# Patient Record
Sex: Male | Born: 1970 | Race: White | Hispanic: No | Smoking: Never smoker
Health system: Southern US, Community
[De-identification: ages and names within clinical notes are randomized; demographics above are authoritative.]

## PROBLEM LIST (undated history)

## (undated) DIAGNOSIS — J45909 Unspecified asthma, uncomplicated: Secondary | ICD-10-CM

## (undated) DIAGNOSIS — E785 Hyperlipidemia, unspecified: Secondary | ICD-10-CM

## (undated) HISTORY — PX: HERNIA REPAIR: SHX51

## (undated) HISTORY — PX: CHOLECYSTECTOMY: SHX55

## (undated) HISTORY — DX: Hyperlipidemia, unspecified: E78.5

## (undated) HISTORY — DX: Unspecified asthma, uncomplicated: J45.909

---

## 2013-08-07 ENCOUNTER — Other Ambulatory Visit: Payer: Self-pay | Admitting: Orthopedic Surgery

## 2013-08-07 DIAGNOSIS — M79672 Pain in left foot: Secondary | ICD-10-CM

## 2018-06-13 ENCOUNTER — Other Ambulatory Visit: Payer: Self-pay | Admitting: Gastroenterology

## 2018-06-13 DIAGNOSIS — R1033 Periumbilical pain: Secondary | ICD-10-CM

## 2018-06-19 ENCOUNTER — Ambulatory Visit
Admission: RE | Admit: 2018-06-19 | Discharge: 2018-06-19 | Disposition: A | Discharge status: Home / Self Care | Payer: BLUE CROSS/BLUE SHIELD | Source: Ambulatory Visit | Attending: Gastroenterology | Admitting: Gastroenterology

## 2018-06-19 DIAGNOSIS — R1033 Periumbilical pain: Secondary | ICD-10-CM

## 2018-06-19 MED ORDER — IOPAMIDOL (ISOVUE-300) INJECTION 61%
100.0000 mL | Freq: Once | INTRAVENOUS | Status: AC | PRN
  Administered 2018-06-19: 100 mL via INTRAVENOUS

## 2020-08-12 ENCOUNTER — Other Ambulatory Visit: Payer: Self-pay

## 2020-08-12 ENCOUNTER — Ambulatory Visit (INDEPENDENT_AMBULATORY_CARE_PROVIDER_SITE_OTHER): Payer: BC Managed Care – PPO | Admitting: Cardiology

## 2020-08-12 ENCOUNTER — Encounter: Payer: Self-pay | Admitting: Cardiology

## 2020-08-12 VITALS — BP 115/78 | HR 58 | Temp 97.5°F | Ht 70.0 in | Wt 177.4 lb

## 2020-08-12 DIAGNOSIS — R0609 Other forms of dyspnea: Secondary | ICD-10-CM

## 2020-08-12 DIAGNOSIS — E785 Hyperlipidemia, unspecified: Secondary | ICD-10-CM | POA: Insufficient documentation

## 2020-08-12 DIAGNOSIS — Z8249 Family history of ischemic heart disease and other diseases of the circulatory system: Secondary | ICD-10-CM | POA: Insufficient documentation

## 2020-08-12 DIAGNOSIS — Z01812 Encounter for preprocedural laboratory examination: Secondary | ICD-10-CM

## 2020-08-12 DIAGNOSIS — R072 Precordial pain: Secondary | ICD-10-CM | POA: Diagnosis not present

## 2020-08-12 DIAGNOSIS — R0600 Dyspnea, unspecified: Secondary | ICD-10-CM | POA: Diagnosis not present

## 2020-08-12 DIAGNOSIS — E7800 Pure hypercholesterolemia, unspecified: Secondary | ICD-10-CM | POA: Diagnosis not present

## 2020-08-12 DIAGNOSIS — Z7189 Other specified counseling: Secondary | ICD-10-CM

## 2020-08-12 LAB — BASIC METABOLIC PANEL
BUN/Creatinine Ratio: 15 (ref 9–20)
BUN: 20 mg/dL (ref 6–24)
CO2: 25 mmol/L (ref 20–29)
Calcium: 10.2 mg/dL (ref 8.7–10.2)
Chloride: 103 mmol/L (ref 96–106)
Creatinine, Ser: 1.36 mg/dL — ABNORMAL HIGH (ref 0.76–1.27)
GFR calc Af Amer: 70 mL/min/{1.73_m2} (ref >59)
GFR calc non Af Amer: 60 mL/min/{1.73_m2} (ref >59)
Glucose: 96 mg/dL (ref 65–99)
Potassium: 5.5 mmol/L — ABNORMAL HIGH (ref 3.5–5.2)
Sodium: 140 mmol/L (ref 134–144)

## 2020-08-12 NOTE — Progress Notes (Signed)
Cardiology Office Note:    Date:  08/12/2020   ID:  Jake Melendez, DOB [birth year], MRN [id number]  PCP:  Marda Stalker, PA-C  Cardiologist:  Buford Dresser, MD  Referring MD: Marda Stalker, PA-C   CC: new patient consultation for hypercholesterolemia and dyspnea on exertion  History of Present Illness:    Jake Melendez is a 49 y.o. male with a hx of hyperlipidemia who is seen as a new consult at the request of Marda Stalker, PA-C for the evaluation and management of hyperlipidemia and exertional dyspnea.  Note from 07/06/20 from visit with Marda Stalker, PA reviewed. Noted to have hyperlipidemia. Has changed diet and lifestyle. Recommended to discussed coronary calcium score.  Reviewed history of cholesterol labs per [facility] notes: 06/29/20 Tchol 248, TG 109, HDL 55, LDL 174, NHDL 193  09/25/19 Tchol 277, TG 87, LDL 204, NHDL 221  10/08/2017 Tchol 223, TG 100, LDL 157, HDL 46  Today: Concerned that he has had exertional dyspnea in recent months. Used to race bikes, very active. Resting heart rate used to be in 50s, now closer to 60, which concerns him. Had foot issue, has been less active in the last three months but plans to start back.  Some days can walk 5-6 miles without issues, other times he has to stop from shortness of breath after walking about 10 minutes.  Cardiovascular risk factors: Prior clinical ASCVD: none Comorbid conditions: hyperlipidemia. Denies hypertension, diabetes, chronic kidney disease Metabolic syndrome/Obesity: BMI 25 Chronic inflammatory conditions: none Tobacco use history: never Family history: Mat Gma with stroke, Teena Irani had MI in her 65s. Pat Gpa died of MI in his early 28s (was a smoker). Father active, healthy in his late 34s. Mother is active, has high blood pressure. 1 sister, 5 years younger, healthy. Prior cardiac testing and/or incidental findings on other testing (ie coronary calcium): Exercise  level: used to be very active with biking, still walks. Getting back to biking now. Current diet: has been strict plant based in the past, has loosened up slightly recently. Difficult as partner, 4 kids don't eat plant based.  Denies chest pain, shortness of breath at rest. No PND, orthopnea, LE edema or unexpected weight gain. No syncope or palpitations.   Past Medical History:  Diagnosis Date  . Asthma   . Hyperlipidemia     Past Surgical History:  Procedure Laterality Date  . CHOLECYSTECTOMY    . HERNIA REPAIR      Current Medications: Current Outpatient Medications on File Prior to Visit  Medication Sig  . albuterol (PROVENTIL) (5 MG/ML) 0.5% nebulizer solution Take 2.5 mg by nebulization every 6 (six) hours as needed for wheezing or shortness of breath.  . budesonide-formoterol (SYMBICORT) 80-4.5 MCG/ACT inhaler Inhale 2 puffs into the lungs daily.  . cetirizine (ZYRTEC) 10 MG tablet Take 10 mg by mouth daily.  Marland Kitchen EPINEPHrine 0.3 mg/0.3 mL IJ SOAJ injection SMARTSIG:1 Pre-Filled Pen Syringe IM Once PRN  . mometasone (NASONEX) 50 MCG/ACT nasal spray Place 2 sprays into the nose as needed.   No current facility-administered medications on file prior to visit.     Allergies:   Patient has no allergy information on record.   Social History   Tobacco Use  . Smoking status: Never Smoker  . Smokeless tobacco: Never Used  Substance Use Topics  . Alcohol use: Not Currently  . Drug use: Never    Family History: family history includes Heart attack in his paternal grandfather and paternal grandmother; Stroke  in his maternal grandmother.  ROS:   Please see the history of present illness.  Additional pertinent ROS: Constitutional: Negative for chills, fever, night sweats, unintentional weight loss  HENT: Negative for ear pain and hearing loss.   Eyes: Negative for loss of vision and eye pain.  Respiratory: Negative for cough, sputum, wheezing.   Cardiovascular: See  HPI. Gastrointestinal: Negative for abdominal pain, melena, and hematochezia.  Genitourinary: Negative for dysuria and hematuria.  Musculoskeletal: Negative for falls and myalgias.  Skin: Negative for itching and rash.  Neurological: Negative for focal weakness, focal sensory changes and loss of consciousness.  Endo/Heme/Allergies: Does not bruise/bleed easily.     EKGs/Labs/Other Studies Reviewed:    The following studies were reviewed today: No prior cardiac studies  EKG:  EKG is personally reviewed.  The ekg ordered today demonstrates sinus bradycardia at 58 bpm  Recent Labs: No results found for requested labs within last 8760 hours.  Recent Lipid Panel No results found for: CHOL, TRIG, HDL, CHOLHDL, VLDL, LDLCALC, LDLDIRECT  Physical Exam:    VS:  BP 115/78   Pulse (!) 58   Temp (!) 97.5 F (36.4 C)   Ht 5' 10"  (1.778 m)   Wt 177 lb 6.4 oz (80.5 kg)   SpO2 98%   BMI 25.45 kg/m     Wt Readings from Last 3 Encounters:  08/12/20 177 lb 6.4 oz (80.5 kg)    GEN: Well nourished, well developed in no acute distress HEENT: Normal, moist mucous membranes NECK: No JVD CARDIAC: regular rhythm, normal S1 and S2, no rubs or gallops. No murmurs. VASCULAR: Radial and DP pulses 2+ bilaterally. No carotid bruits RESPIRATORY:  Clear to auscultation without rales, wheezing or rhonchi  ABDOMEN: Soft, non-tender, non-distended MUSCULOSKELETAL:  Ambulates independently SKIN: Warm and dry, no edema NEUROLOGIC:  Alert and oriented x 3. No focal neuro deficits noted. PSYCHIATRIC:  Normal affect    ASSESSMENT:    1. Dyspnea on exertion   2. Family history of premature CAD   3. Pure hypercholesterolemia   4. Precordial pain   5. Pre-procedure lab exam   6. Cardiac risk counseling   7. Counseling on health promotion and disease prevention    PLAN:    Dyspnea on exertion Hypercholesterolemia Family history of premature CAD -I am concerned that his dyspnea on exertion is an  anginal equivalent. He has longstanding hypercholesterolemia and a family history of premature CAD, with his grandfather dying of an MI in his early 53s.  -we did discuss a calcium score, but with his symptoms I would prefer an evaluation for ischemia -discussed treadmill stress, nuclear stress/lexiscan, and CT coronary angiography. Discussed pros and cons of each, including but not limited to false positive/false negative risk, radiation risk, and risk of IV contrast dye. Based on shared decision making, decision was made to pursue CT coronary angiography. -resting heart rate 58 bpm, will hold on oral beta blocker but can get IV if needed. -counseled on need to get BMET prior to test -counseled on use of sublingual nitroglycerin and its importance to a good test  Cardiac risk counseling and prevention recommendations: -recommend heart healthy/Mediterranean diet, with whole grains, fruits, vegetable, fish, lean meats, nuts, and olive oil. Limit salt. -recommend moderate walking, 3-5 times/week for 30-50 minutes each session. Aim for at least 150 minutes.week. Goal should be pace of 3 miles/hours, or walking 1.5 miles in 30 minutes -recommend avoidance of tobacco products. Avoid excess alcohol. -ASCVD risk score: 3.5% ten year, but  50% lifetime The ASCVD Risk score Mikey Bussing DC Jr., et al., 2013) failed to calculate for the following reasons:   Cannot find a previous HDL lab   Cannot find a previous total cholesterol lab    Plan for follow up: to be determined based on results of testing  Buford Dresser, MD, PhD [place]  [facility]    Medication Adjustments/Labs and Tests Ordered: Current medicines are reviewed at length with the patient today.  Concerns regarding medicines are outlined above.  Orders Placed This Encounter  Procedures  . CT CORONARY MORPH W/CTA COR W/SCORE W/CA W/CM &/OR WO/CM  . CT CORONARY FRACTIONAL FLOW RESERVE DATA PREP  . CT CORONARY FRACTIONAL FLOW  RESERVE FLUID ANALYSIS  . Basic metabolic panel  . EKG 12-Lead   No orders of the defined types were placed in this encounter.   Patient Instructions  Medication Instructions:  Your Physician recommend you continue on your current medication as directed.    *If you need a refill on your cardiac medications before your next appointment, please call your pharmacy*   Lab Work: Your physician recommends that you return for lab work today ( BMP).  If you have labs (blood work) drawn today and your tests are completely normal, you will receive your results only by: Marland Kitchen MyChart Message (if you have MyChart) OR . A paper copy in the mail If you have any lab test that is abnormal or we need to change your treatment, we will call you to review the results.   Testing/Procedures: Cardiac CT Angiography (CTA), is a special type of CT scan that uses a computer to produce multi-dimensional views of major blood vessels throughout the body. In CT angiography, a contrast material is injected through an IV to help visualize the blood vessels [facility]   Follow-Up: At [facility], you and your health needs are our priority.  As part of our continuing mission to provide you with exceptional heart care, we have created designated Provider Care Teams.  These Care Teams include your primary Cardiologist (physician) and Advanced Practice Providers (APPs -  Physician Assistants and Nurse Practitioners) who all work together to provide you with the care you need, when you need it.  We recommend signing up for the patient portal called "MyChart".  Sign up information is provided on this After Visit Summary.  MyChart is used to connect with patients for Virtual Visits (Telemedicine).  Patients are able to view lab/test results, encounter notes, upcoming appointments, etc.  Non-urgent messages can be sent to your provider as well.   To learn more about what you can do with MyChart, go to  [email or web address].    Your next appointment:   Based off test result  The format for your next appointment:   In Person  Provider:   Buford Dresser, MD   Your cardiac CT will be scheduled at one of the below locations:   [facility] [address] [place], [place] [address] [phone]  If scheduled at [facility], please arrive at the [facility] main entrance of [facility] 30 minutes prior to test start time. Proceed to the [facility] Radiology Department (first floor) to check-in and test prep.  If scheduled at [facility], please arrive 15 mins early for check-in and test prep.  Please follow these instructions carefully (unless otherwise directed):  Hold all erectile dysfunction medications at least 3 days (72 hrs) prior to test.  On the  Night Before the Test: . Be sure to Drink plenty of water. . Do not consume any caffeinated/decaffeinated beverages or chocolate 12 hours prior to your test. . Do not take any antihistamines 12 hours prior to your test.   On the Day of the Test: . Drink plenty of water. Do not drink any water within one hour of the test. . Do not eat any food 4 hours prior to the test. . You may take your regular medications prior to the test.   After the Test: . Drink plenty of water. . After receiving IV contrast, you may experience a mild flushed feeling. This is normal. . On occasion, you may experience a mild rash up to 24 hours after the test. This is not dangerous. If this occurs, you can take Benadryl 25 mg and increase your fluid intake. . If you experience trouble breathing, this can be serious. If it is severe call 911 IMMEDIATELY. If it is mild, please call our office. . If you take any of these medications: Glipizide/Metformin, Avandament, Glucavance, please do not take 48 hours after completing test unless otherwise instructed.   Once we have confirmed  authorization from your insurance company, we will call you to set up a date and time for your test. Based on how quickly your insurance processes prior authorizations requests, please allow up to 4 weeks to be contacted for scheduling your Cardiac CT appointment. Be advised that routine Cardiac CT appointments could be scheduled as many as 8 weeks after your provider has ordered it.  For non-scheduling related questions, please contact the cardiac imaging nurse navigator should you have any questions/concerns: Marchia Bond, Cardiac Imaging Nurse Navigator Burley Saver, Interim Cardiac Imaging Nurse [facility] Direct Office Dial: [phone]   For scheduling needs, including cancellations and rescheduling, please call Vivien Rota at [phone], option 3.    Cardiac CT Angiogram A cardiac CT angiogram is a procedure to look at the heart and the area around the heart. It may be done to help find the cause of chest pains or other symptoms of heart disease. During this procedure, a substance called contrast dye is injected into the blood vessels in the area to be checked. A large X-ray machine, called a CT scanner, then takes detailed pictures of the heart and the surrounding area. The procedure is also sometimes called a coronary CT angiogram, coronary artery scanning, or CTA. A cardiac CT angiogram allows the health care provider to see how well blood is flowing to and from the heart. The health care provider will be able to see if there are any problems, such as:  Blockage or narrowing of the coronary arteries in the heart.  Fluid around the heart.  Signs of weakness or disease in the muscles, valves, and tissues of the heart. Tell a health care provider about:  Any allergies you have. This is especially important if you have had a previous allergic reaction to contrast dye.  All medicines you are taking, including vitamins, herbs, eye drops, creams, and  over-the-counter medicines.  Any blood disorders you have.  Any surgeries you have had.  Any medical conditions you have.  Whether you are pregnant or may be pregnant.  Any anxiety disorders, chronic pain, or other conditions you have that may increase your stress or prevent you from lying still. What are the risks? Generally, this is a safe procedure. However, problems may occur, including:  Bleeding.  Infection.  Allergic reactions to medicines  or dyes.  Damage to other structures or organs.  Kidney damage from the contrast dye that is used.  Increased risk of cancer from radiation exposure. This risk is low. Talk with your health care provider about: ? The risks and benefits of testing. ? How you can receive the lowest dose of radiation. What happens before the procedure?  Wear comfortable clothing and remove any jewelry, glasses, dentures, and hearing aids.  Follow instructions from your health care provider about eating and drinking. This may include: ? For 12 hours before the procedure -- avoid caffeine. This includes tea, coffee, soda, energy drinks, and diet pills. Drink plenty of water or other fluids that do not have caffeine in them. Being well hydrated can prevent complications. ? For 4-6 hours before the procedure -- stop eating and drinking. The contrast dye can cause nausea, but this is less likely if your stomach is empty.  Ask your health care provider about changing or stopping your regular medicines. This is especially important if you are taking diabetes medicines, blood thinners, or medicines to treat problems with erections (erectile dysfunction). What happens during the procedure?   Hair on your chest may need to be removed so that small sticky patches called electrodes can be placed on your chest. These will transmit information that helps to monitor your heart during the procedure.  An IV will be inserted into one of your veins.  You might be given  a medicine to control your heart rate during the procedure. This will help to ensure that good images are obtained.  You will be asked to lie on an exam table. This table will slide in and out of the CT machine during the procedure.  Contrast dye will be injected into the IV. You might feel warm, or you may get a metallic taste in your mouth.  You will be given a medicine called nitroglycerin. This will relax or dilate the arteries in your heart.  The table that you are lying on will move into the CT machine tunnel for the scan.  The person running the machine will give you instructions while the scans are being done. You may be asked to: ? Keep your arms above your head. ? Hold your breath. ? Stay very still, even if the table is moving.  When the scanning is complete, you will be moved out of the machine.  The IV will be removed. The procedure may vary among health care providers and hospitals. What can I expect after the procedure? After your procedure, it is common to have:  A metallic taste in your mouth from the contrast dye.  A feeling of warmth.  A headache from the nitroglycerin. Follow these instructions at home:  Take over-the-counter and prescription medicines only as told by your health care provider.  If you are told, drink enough fluid to keep your urine pale yellow. This will help to flush the contrast dye out of your body.  Most people can return to their normal activities right after the procedure. Ask your health care provider what activities are safe for you.  It is up to you to get the results of your procedure. Ask your health care provider, or the department that is doing the procedure, when your results will be ready.  Keep all follow-up visits as told by your health care provider. This is important. Contact a health care provider if:  You have any symptoms of allergy to the contrast dye. These include: ? Shortness  of breath. ? Rash or hives. ? A  racing heartbeat. Summary  A cardiac CT angiogram is a procedure to look at the heart and the area around the heart. It may be done to help find the cause of chest pains or other symptoms of heart disease.  During this procedure, a large X-ray machine, called a CT scanner, takes detailed pictures of the heart and the surrounding area after a contrast dye has been injected into blood vessels in the area.  Ask your health care provider about changing or stopping your regular medicines before the procedure. This is especially important if you are taking diabetes medicines, blood thinners, or medicines to treat erectile dysfunction.  If you are told, drink enough fluid to keep your urine pale yellow. This will help to flush the contrast dye out of your body. This information is not intended to replace advice given to you by your health care provider. Make sure you discuss any questions you have with your health care provider. Document Revised: 06/25/2019 Document Reviewed: 06/25/2019 Elsevier Patient Education  2020 [employer].    Signed, Buford Dresser, MD PhD 08/12/2020 9:56 AM    [place]

## 2020-08-12 NOTE — Patient Instructions (Addendum)
Medication Instructions:  Your Physician recommend you continue on your current medication as directed.    *If you need a refill on your cardiac medications before your next appointment, please call your pharmacy*   Lab Work: Your physician recommends that you return for lab work today ( BMP).  If you have labs (blood work) drawn today and your tests are completely normal, you will receive your results only by: Marland Kitchen MyChart Message (if you have MyChart) OR . A paper copy in the mail If you have any lab test that is abnormal or we need to change your treatment, we will call you to review the results.   Testing/Procedures: Cardiac CT Angiography (CTA), is a special type of CT scan that uses a computer to produce multi-dimensional views of major blood vessels throughout the body. In CT angiography, a contrast material is injected through an IV to help visualize the blood vessels [facility]   Follow-Up: At [facility], you and your health needs are our priority.  As part of our continuing mission to provide you with exceptional heart care, we have created designated Provider Care Teams.  These Care Teams include your primary Cardiologist (physician) and Advanced Practice Providers (APPs -  Physician Assistants and Nurse Practitioners) who all work together to provide you with the care you need, when you need it.  We recommend signing up for the patient portal called "MyChart".  Sign up information is provided on this After Visit Summary.  MyChart is used to connect with patients for Virtual Visits (Telemedicine).  Patients are able to view lab/test results, encounter notes, upcoming appointments, etc.  Non-urgent messages can be sent to your provider as well.   To learn more about what you can do with MyChart, go to [email or web address].    Your next appointment:   Based off test result  The format for your next appointment:   In Person  Provider:   Buford Dresser, MD    Your cardiac CT will be scheduled at one of the below locations:   [facility] [address] [place], [place] [address] [phone]  If scheduled at [facility], please arrive at the [facility] main entrance of [facility] 30 minutes prior to test start time. Proceed to the [facility] Radiology Department (first floor) to check-in and test prep.  If scheduled at [facility], please arrive 15 mins early for check-in and test prep.  Please follow these instructions carefully (unless otherwise directed):  Hold all erectile dysfunction medications at least 3 days (72 hrs) prior to test.  On the Night Before the Test: . Be sure to Drink plenty of water. . Do not consume any caffeinated/decaffeinated beverages or chocolate 12 hours prior to your test. . Do not take any antihistamines 12 hours prior to your test.   On the Day of the Test: . Drink plenty of water. Do not drink any water within one hour of the test. . Do not eat any food 4 hours prior to the test. . You may take your regular medications prior to the test.   After the Test: . Drink plenty of water. . After receiving IV contrast, you may experience a mild flushed feeling. This is normal. . On occasion, you may experience a mild rash up to 24 hours after the test. This is not dangerous. If this occurs, you can take Benadryl 25 mg and increase your fluid intake. . If you experience trouble breathing,  this can be serious. If it is severe call 911 IMMEDIATELY. If it is mild, please call our office. . If you take any of these medications: Glipizide/Metformin, Avandament, Glucavance, please do not take 48 hours after completing test unless otherwise instructed.   Once we have confirmed authorization from your insurance company, we will call you to set up a date and time for your test. Based on how quickly your insurance processes prior authorizations requests, please  allow up to 4 weeks to be contacted for scheduling your Cardiac CT appointment. Be advised that routine Cardiac CT appointments could be scheduled as many as 8 weeks after your provider has ordered it.  For non-scheduling related questions, please contact the cardiac imaging nurse navigator should you have any questions/concerns: Marchia Bond, Cardiac Imaging Nurse Navigator Burley Saver, Interim Cardiac Imaging Nurse [place] [facility] Direct Office Dial: [phone]   For scheduling needs, including cancellations and rescheduling, please call Vivien Rota at [phone], option [phone].    Cardiac CT Angiogram A cardiac CT angiogram is a procedure to look at the heart and the area around the heart. It may be done to help find the cause of chest pains or other symptoms of heart disease. During this procedure, a substance called contrast dye is injected into the blood vessels in the area to be checked. A large X-ray machine, called a CT scanner, then takes detailed pictures of the heart and the surrounding area. The procedure is also sometimes called a coronary CT angiogram, coronary artery scanning, or CTA. A cardiac CT angiogram allows the health care provider to see how well blood is flowing to and from the heart. The health care provider will be able to see if there are any problems, such as:  Blockage or narrowing of the coronary arteries in the heart.  Fluid around the heart.  Signs of weakness or disease in the muscles, valves, and tissues of the heart. Tell a health care provider about:  Any allergies you have. This is especially important if you have had a previous allergic reaction to contrast dye.  All medicines you are taking, including vitamins, herbs, eye drops, creams, and over-the-counter medicines.  Any blood disorders you have.  Any surgeries you have had.  Any medical conditions you have.  Whether you are pregnant or may be pregnant.  Any anxiety  disorders, chronic pain, or other conditions you have that may increase your stress or prevent you from lying still. What are the risks? Generally, this is a safe procedure. However, problems may occur, including:  Bleeding.  Infection.  Allergic reactions to medicines or dyes.  Damage to other structures or organs.  Kidney damage from the contrast dye that is used.  Increased risk of cancer from radiation exposure. This risk is low. Talk with your health care provider about: ? The risks and benefits of testing. ? How you can receive the lowest dose of radiation. What happens before the procedure?  Wear comfortable clothing and remove any jewelry, glasses, dentures, and hearing aids.  Follow instructions from your health care provider about eating and drinking. This may include: ? For 12 hours before the procedure - avoid caffeine. This includes tea, coffee, soda, energy drinks, and diet pills. Drink plenty of water or other fluids that do not have caffeine in them. Being well hydrated can prevent complications. ? For 4-6 hours before the procedure - stop eating and drinking. The contrast dye can cause nausea, but this is less likely  if your stomach is empty.  Ask your health care provider about changing or stopping your regular medicines. This is especially important if you are taking diabetes medicines, blood thinners, or medicines to treat problems with erections (erectile dysfunction). What happens during the procedure?   Hair on your chest may need to be removed so that small sticky patches called electrodes can be placed on your chest. These will transmit information that helps to monitor your heart during the procedure.  An IV will be inserted into one of your veins.  You might be given a medicine to control your heart rate during the procedure. This will help to ensure that good images are obtained.  You will be asked to lie on an exam table. This table will slide in and  out of the CT machine during the procedure.  Contrast dye will be injected into the IV. You might feel warm, or you may get a metallic taste in your mouth.  You will be given a medicine called nitroglycerin. This will relax or dilate the arteries in your heart.  The table that you are lying on will move into the CT machine tunnel for the scan.  The person running the machine will give you instructions while the scans are being done. You may be asked to: ? Keep your arms above your head. ? Hold your breath. ? Stay very still, even if the table is moving.  When the scanning is complete, you will be moved out of the machine.  The IV will be removed. The procedure may vary among health care providers and hospitals. What can I expect after the procedure? After your procedure, it is common to have:  A metallic taste in your mouth from the contrast dye.  A feeling of warmth.  A headache from the nitroglycerin. Follow these instructions at home:  Take over-the-counter and prescription medicines only as told by your health care provider.  If you are told, drink enough fluid to keep your urine pale yellow. This will help to flush the contrast dye out of your body.  Most people can return to their normal activities right after the procedure. Ask your health care provider what activities are safe for you.  It is up to you to get the results of your procedure. Ask your health care provider, or the department that is doing the procedure, when your results will be ready.  Keep all follow-up visits as told by your health care provider. This is important. Contact a health care provider if:  You have any symptoms of allergy to the contrast dye. These include: ? Shortness of breath. ? Rash or hives. ? A racing heartbeat. Summary  A cardiac CT angiogram is a procedure to look at the heart and the area around the heart. It may be done to help find the cause of chest pains or other symptoms of  heart disease.  During this procedure, a large X-ray machine, called a CT scanner, takes detailed pictures of the heart and the surrounding area after a contrast dye has been injected into blood vessels in the area.  Ask your health care provider about changing or stopping your regular medicines before the procedure. This is especially important if you are taking diabetes medicines, blood thinners, or medicines to treat erectile dysfunction.  If you are told, drink enough fluid to keep your urine pale yellow. This will help to flush the contrast dye out of your body. This information is not intended to replace  advice given to you by your health care provider. Make sure you discuss any questions you have with your health care provider. Document Revised: 06/25/2019 Document Reviewed: 06/25/2019 Elsevier Patient Education  [place].

## 2020-08-24 ENCOUNTER — Telehealth (HOSPITAL_COMMUNITY): Payer: Self-pay | Admitting: *Deleted

## 2020-08-24 NOTE — Telephone Encounter (Signed)
Attempted to call patient regarding upcoming cardiac CT appointment. Left message on voicemail with name and callback number  Myrel Rappleye Tai RN Navigator Cardiac Imaging [facility] [phone] Office [phone] Cell  

## 2020-08-24 NOTE — Telephone Encounter (Signed)
Pt returning call regarding upcoming cardiac imaging study; pt verbalizes understanding of appt date/time, parking situation and where to check in, pre-test NPO status and medications ordered, and verified current allergies; name and call back number provided for further questions should they arise  Alezandra Egli Tai RN Navigator Cardiac Imaging [place] [facility] [phone] office [phone] cell  

## 2020-08-26 ENCOUNTER — Ambulatory Visit (HOSPITAL_COMMUNITY)
Admission: RE | Admit: 2020-08-26 | Discharge: 2020-08-26 | Disposition: A | Discharge status: Home / Self Care | Payer: BC Managed Care – PPO | Source: Ambulatory Visit | Attending: Cardiology | Admitting: Cardiology

## 2020-08-26 ENCOUNTER — Other Ambulatory Visit: Payer: Self-pay

## 2020-08-26 DIAGNOSIS — R072 Precordial pain: Secondary | ICD-10-CM | POA: Diagnosis not present

## 2020-08-26 MED ORDER — IOHEXOL 350 MG/ML SOLN
80.0000 mL | Freq: Once | INTRAVENOUS | Status: AC | PRN
  Administered 2020-08-26: 80 mL via INTRAVENOUS

## 2020-08-26 MED ORDER — NITROGLYCERIN 0.4 MG SL SUBL
0.8000 mg | SUBLINGUAL_TABLET | Freq: Once | SUBLINGUAL | Status: AC
  Administered 2020-08-26: 0.8 mg via SUBLINGUAL

## 2020-08-26 MED ORDER — NITROGLYCERIN 0.4 MG SL SUBL
SUBLINGUAL_TABLET | SUBLINGUAL | Status: AC
  Filled 2020-08-26: qty 2

## 2020-08-30 ENCOUNTER — Other Ambulatory Visit: Payer: Self-pay

## 2020-08-30 MED ORDER — ROSUVASTATIN CALCIUM 20 MG PO TABS: ORAL_TABLET | ORAL | 3 refills | Status: DC

## 2020-12-07 IMAGING — CT CT HEART MORP W/ CTA COR W/ SCORE W/ CA W/CM &/OR W/O CM
4 of 7 series · 8 of 20 positions shown, 9 images · IV contrast (APPLIED)
Comparison: No priors.
COMPARISON: No priors.

Addendum:
EXAM:
OVER-READ INTERPRETATION  CT CHEST

The following report is an over-read performed by radiologist Dr.
Ib Damm [REDACTED] on 08/26/2020. This
over-read does not include interpretation of cardiac or coronary
anatomy or pathology. The coronary calcium score/coronary CTA
interpretation by the cardiologist is attached.
HISTORY: Chest pain, nonspecific
Cardiac/Coronary CT
TECHNIQUE: The patient was scanned on a Siemens Force scanner.
PROTOCOL: A 120 kV prospective scan was triggered in the descending thoracic
aorta at 111 HU's. Axial non-contrast 3 mm slices were carried out
through the heart. The data set was analyzed on a dedicated work
station and scored using the Agatson method. Gantry rotation speed
was 250 msecs and collimation was 0.6 mm. Beta blockade and 0.8 mg
of sl NTG was given. The 3D data set was reconstructed in 5%
intervals of 35-75% of the R-R cycle. Diastolic phases were analyzed
on a dedicated work station using MPR, MIP and VRT modes. The
patient received 80mL OMNIPAQUE IOHEXOL 350 MG/ML SOLN of contrast.

[Series 7: best diast 74 % · axial · 0.37mm/px · z∈[+1242,+1281]mm · 2 of 296 slices shown, 3 images]
[im 99/296  vessel]
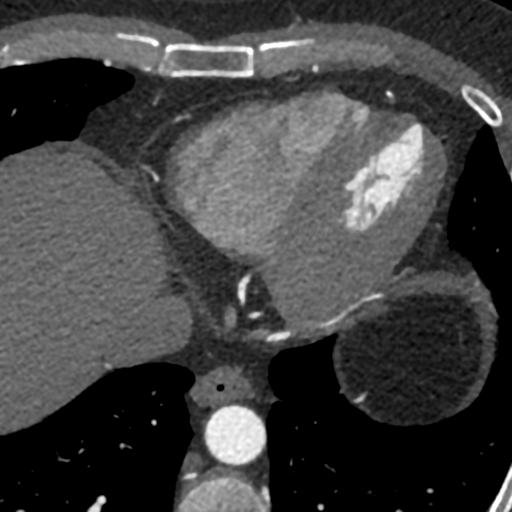
[im 99/296  lung]
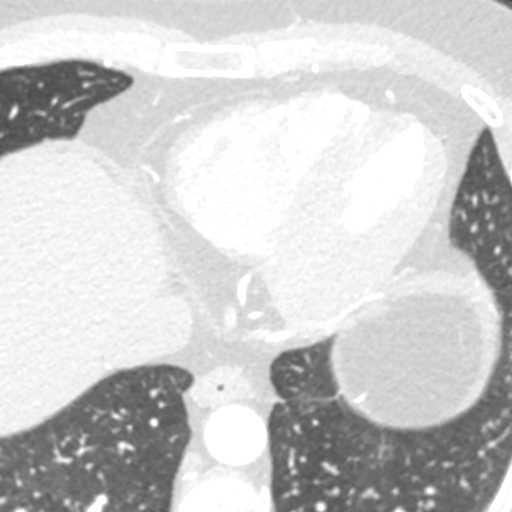
[im 197/296  vessel]
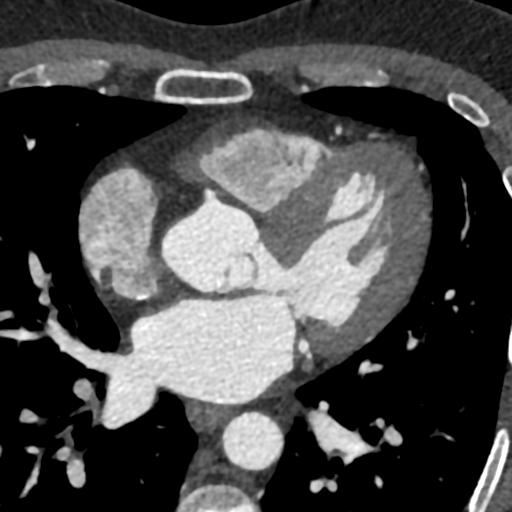

[Series 8: best syst 39 % · axial · 0.37mm/px · z∈[+1242,+1281]mm · 2 of 296 slices shown]
[im 99/296  vessel]
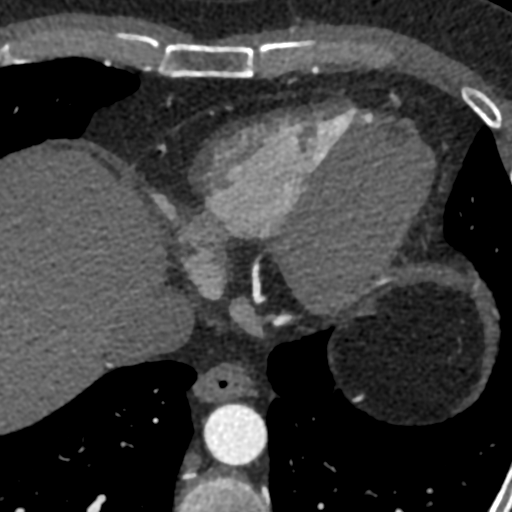
[im 197/296  vessel]
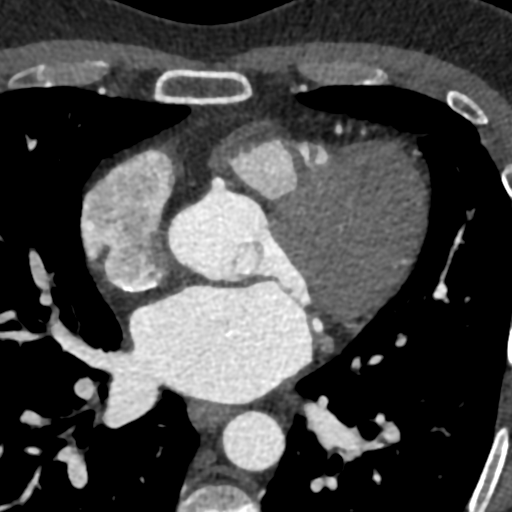

[Series 9: ts diast sharp 74 % · axial · 0.37mm/px · z∈[+1242,+1281]mm · 2 of 296 slices shown]
[im 99/296  lung]
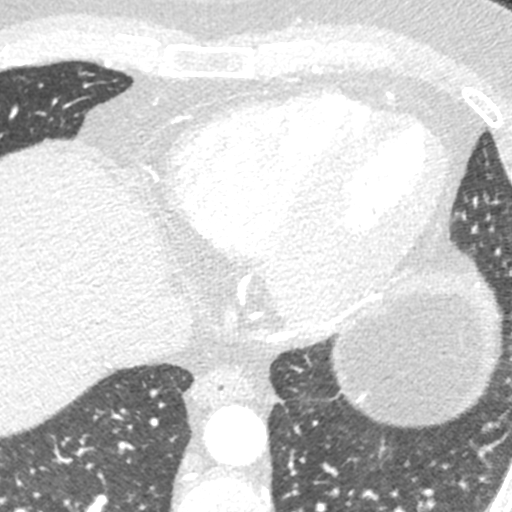
[im 197/296  lung]
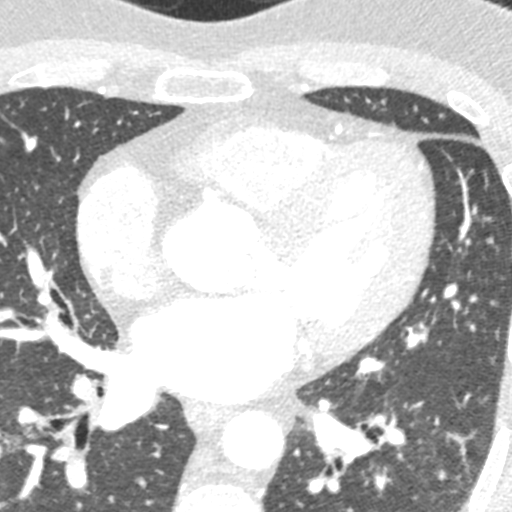

[Series 10: ts syst sharp 39 % · axial · 0.37mm/px · z∈[+1242,+1281]mm · 2 of 296 slices shown]
[im 99/296  lung]
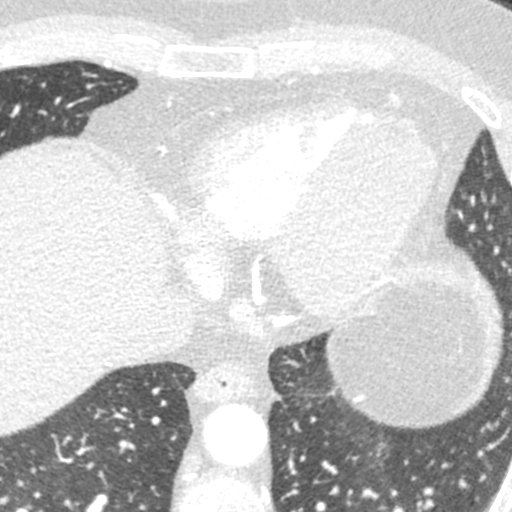
[im 197/296  lung]
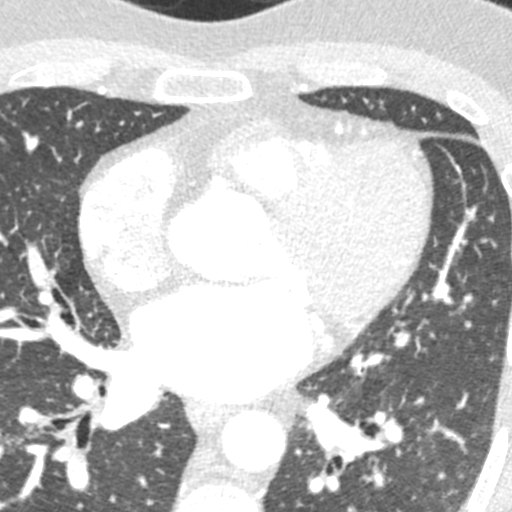

[8 of 20 positions shown; findings below may reference images not displayed]

FINDINGS: Within the visualized portions of the thorax there are no suspicious
appearing pulmonary nodules or masses, there is no acute
consolidative airspace disease, no pleural effusions, no
pneumothorax and no lymphadenopathy. Visualized portions of the
upper abdomen are unremarkable. There are no aggressive appearing
lytic or blastic lesions noted in the visualized portions of the
skeleton.
IMPRESSION: No significant incidental noncardiac findings are noted.
FINDINGS: Coronary calcium score: The patient's coronary artery calcium score
is 11, which places the patient in the 42 percentile.

Coronary arteries: Normal coronary origins.  Left dominance.

Right Coronary Artery: Normal caliber vessel, short nondominant
course. No significant plaque or stenosis.

Left Main Coronary Artery: Normal caliber vessel. No significant
plaque or stenosis.

Left Anterior Descending Coronary Artery: Normal caliber vessel.
There are two small areas of calcified plaque, one in the mid LAD
and one in the distal LAD. Minimal stenosis of 1-24%. Gives rise to
2 diagonal branches. Distal LAD wraps apex.

Left Circumflex Artery: Normal caliber vessel, gives rise to PDA. No
significant plaque or stenosis. Gives rise to 2 OM branches.

Aorta: Normal size, 32 mm at the mid ascending aorta (level of the
PA bifurcation) measured double oblique. No calcifications. No
dissection.

Aortic Valve: No calcifications. Trileaflet.

Other findings:

Normal pulmonary vein drainage into the left atrium.

Normal left atrial appendage without a thrombus.

Normal size of the pulmonary artery.
IMPRESSION: 1.  Minimal nonobstructive CAD, CADRADS = 1.

2. Coronary calcium score of 11. This was 42nd percentile for age
and sex matched control.

3. Normal coronary origin with left dominance.

*** End of Addendum ***
EXAM:
OVER-READ INTERPRETATION  CT CHEST

The following report is an over-read performed by radiologist Dr.
Ib Damm [REDACTED] on 08/26/2020. This
over-read does not include interpretation of cardiac or coronary
anatomy or pathology. The coronary calcium score/coronary CTA
interpretation by the cardiologist is attached.
FINDINGS: Within the visualized portions of the thorax there are no suspicious
appearing pulmonary nodules or masses, there is no acute
consolidative airspace disease, no pleural effusions, no
pneumothorax and no lymphadenopathy. Visualized portions of the
upper abdomen are unremarkable. There are no aggressive appearing
lytic or blastic lesions noted in the visualized portions of the
skeleton.
IMPRESSION: No significant incidental noncardiac findings are noted.

## 2021-11-05 ENCOUNTER — Other Ambulatory Visit: Payer: Self-pay | Admitting: Cardiology

## 2022-11-14 ENCOUNTER — Other Ambulatory Visit: Payer: Self-pay | Admitting: Cardiology

## 2023-03-15 ENCOUNTER — Emergency Department (HOSPITAL_BASED_OUTPATIENT_CLINIC_OR_DEPARTMENT_OTHER)
Admission: EM | Admit: 2023-03-15 | Discharge: 2023-03-15 | Disposition: A | Discharge status: Home / Self Care | Payer: No Typology Code available for payment source | Attending: Emergency Medicine | Admitting: Emergency Medicine

## 2023-03-15 ENCOUNTER — Emergency Department (HOSPITAL_BASED_OUTPATIENT_CLINIC_OR_DEPARTMENT_OTHER): Payer: No Typology Code available for payment source

## 2023-03-15 ENCOUNTER — Encounter (HOSPITAL_BASED_OUTPATIENT_CLINIC_OR_DEPARTMENT_OTHER): Payer: Self-pay | Admitting: *Deleted

## 2023-03-15 ENCOUNTER — Other Ambulatory Visit: Payer: Self-pay

## 2023-03-15 DIAGNOSIS — R109 Unspecified abdominal pain: Secondary | ICD-10-CM

## 2023-03-15 DIAGNOSIS — N200 Calculus of kidney: Secondary | ICD-10-CM

## 2023-03-15 DIAGNOSIS — J45909 Unspecified asthma, uncomplicated: Secondary | ICD-10-CM | POA: Insufficient documentation

## 2023-03-15 LAB — URINALYSIS, ROUTINE W REFLEX MICROSCOPIC
Bilirubin Urine: NEGATIVE
Glucose, UA: NEGATIVE mg/dL
Hgb urine dipstick: NEGATIVE
Ketones, ur: NEGATIVE mg/dL
Leukocytes,Ua: NEGATIVE
Nitrite: NEGATIVE
Protein, ur: NEGATIVE mg/dL
Specific Gravity, Urine: 1.013 (ref 1.005–1.030)
pH: 8.5 — ABNORMAL HIGH (ref 5.0–8.0)

## 2023-03-15 LAB — COMPREHENSIVE METABOLIC PANEL
ALT: 30 U/L (ref 0–44)
AST: 23 U/L (ref 15–41)
Albumin: 4.6 g/dL (ref 3.5–5.0)
Alkaline Phosphatase: 53 U/L (ref 38–126)
Anion gap: 11 (ref 5–15)
BUN: 23 mg/dL — ABNORMAL HIGH (ref 6–20)
CO2: 26 mmol/L (ref 22–32)
Calcium: 10.3 mg/dL (ref 8.9–10.3)
Chloride: 104 mmol/L (ref 98–111)
Creatinine, Ser: 1.36 mg/dL — ABNORMAL HIGH (ref 0.61–1.24)
GFR, Estimated: >60 mL/min (ref >60)
Glucose, Bld: 100 mg/dL — ABNORMAL HIGH (ref 70–99)
Potassium: 3.9 mmol/L (ref 3.5–5.1)
Sodium: 141 mmol/L (ref 135–145)
Total Bilirubin: 0.4 mg/dL (ref 0.3–1.2)
Total Protein: 7.2 g/dL (ref 6.5–8.1)

## 2023-03-15 LAB — CBC
HCT: 42.5 % (ref 39.0–52.0)
Hemoglobin: 15.1 g/dL (ref 13.0–17.0)
MCH: 30.2 pg (ref 26.0–34.0)
MCHC: 35.5 g/dL (ref 30.0–36.0)
MCV: 85 fL (ref 80.0–100.0)
Platelets: 236 10*3/uL (ref 150–400)
RBC: 5 MIL/uL (ref 4.22–5.81)
RDW: 12.7 % (ref 11.5–15.5)
WBC: 11.1 10*3/uL — ABNORMAL HIGH (ref 4.0–10.5)
nRBC: 0 % (ref 0.0–0.2)

## 2023-03-15 LAB — LIPASE, BLOOD: Lipase: 43 U/L (ref 11–51)

## 2023-03-15 MED ORDER — OXYCODONE-ACETAMINOPHEN 5-325 MG PO TABS: 1.0000 | ORAL_TABLET | ORAL | 0 refills | Status: AC | PRN

## 2023-03-15 MED ORDER — ONDANSETRON 4 MG PO TBDP: 4.0000 mg | ORAL_TABLET | Freq: Three times a day (TID) | ORAL | 0 refills | Status: AC | PRN

## 2023-03-15 MED ORDER — HYDROMORPHONE HCL 1 MG/ML IJ SOLN
1.0000 mg | Freq: Once | INTRAMUSCULAR | Status: AC
  Administered 2023-03-15: 1 mg via INTRAVENOUS
  Filled 2023-03-15: qty 1

## 2023-03-15 MED ORDER — MAGNESIUM SULFATE 2 GM/50ML IV SOLN
2.0000 g | Freq: Once | INTRAVENOUS | Status: AC
  Administered 2023-03-15: 2 g via INTRAVENOUS
  Filled 2023-03-15: qty 50

## 2023-03-15 MED ORDER — KETOROLAC TROMETHAMINE 15 MG/ML IJ SOLN
15.0000 mg | Freq: Once | INTRAMUSCULAR | Status: AC
  Administered 2023-03-15: 15 mg via INTRAVENOUS
  Filled 2023-03-15: qty 1

## 2023-03-15 MED ORDER — MORPHINE SULFATE (PF) 4 MG/ML IV SOLN
4.0000 mg | Freq: Once | INTRAVENOUS | Status: AC
  Administered 2023-03-15: 4 mg via INTRAVENOUS
  Filled 2023-03-15: qty 1

## 2023-03-15 MED ORDER — TAMSULOSIN HCL 0.4 MG PO CAPS: 0.4000 mg | ORAL_CAPSULE | Freq: Every day | ORAL | 0 refills | Status: AC | PRN

## 2023-03-15 MED ORDER — ONDANSETRON HCL 4 MG/2ML IJ SOLN
4.0000 mg | Freq: Once | INTRAMUSCULAR | Status: AC
  Administered 2023-03-15: 4 mg via INTRAVENOUS
  Filled 2023-03-15: qty 2

## 2023-03-15 NOTE — ED Provider Notes (Signed)
[place] EMERGENCY DEPARTMENT AT [facility] Provider Note   CSN: [id number] Arrival date & time: 03/15/23  2028     History  Chief Complaint  Patient presents with   Abdominal Pain    Jake Melendez is a 52 y.o. male.  The history is provided by the patient and medical records. No language interpreter was used.  Abdominal Pain Pain location:  L flank Pain quality: aching and sharp   Pain radiates to:  Does not radiate Pain severity:  Severe Onset quality:  Gradual Duration:  3 hours Timing:  Constant Progression:  Waxing and waning Chronicity:  New Context: not trauma   Worsened by:  Nothing Ineffective treatments:  None tried Associated symptoms: nausea and vomiting   Associated symptoms: no chills, no constipation, no cough, no diarrhea, no dysuria, no fatigue, no fever and no shortness of breath   Associated symptoms comment:  Urgency present       Home Medications Prior to Admission medications   Medication Sig Start Date End Date Taking? Authorizing Provider  albuterol (PROVENTIL) (5 MG/ML) 0.5% nebulizer solution Take 2.5 mg by nebulization every 6 (six) hours as needed for wheezing or shortness of breath.    [provider]  budesonide-formoterol (SYMBICORT) 80-4.5 MCG/ACT inhaler Inhale 2 puffs into the lungs daily.    [provider]  cetirizine (ZYRTEC) 10 MG tablet Take 10 mg by mouth daily.    [provider]  EPINEPHrine 0.3 mg/0.3 mL IJ SOAJ injection SMARTSIG:1 Pre-Filled Pen Syringe IM Once PRN 05/12/20   [provider]  mometasone (NASONEX) 50 MCG/ACT nasal spray Place 2 sprays into the nose as needed.    [provider]  rosuvastatin (CRESTOR) 20 MG tablet Take one tablet daily 11/08/21   Jodelle Red, MD      Allergies    Patient has no known allergies.    Review of Systems   Review of Systems  Constitutional:  Negative for chills, fatigue and fever.  HENT:  Negative  for congestion.   Respiratory:  Negative for cough, chest tightness, shortness of breath and wheezing.   Gastrointestinal:  Positive for abdominal pain, nausea and vomiting. Negative for constipation and diarrhea.  Genitourinary:  Positive for flank pain and urgency. Negative for dysuria and frequency.  Musculoskeletal:  Positive for back pain.  Psychiatric/Behavioral:  Negative for agitation and confusion.   All other systems reviewed and are negative.   Physical Exam Updated Vital Signs BP (!) 163/102 (BP Location: Right Arm)   Pulse 61   Temp 97.7 F (36.5 C) (Oral)   Resp 20   SpO2 100%  Physical Exam Vitals and nursing note reviewed.  Constitutional:      General: He is not in acute distress.    Appearance: He is well-developed. He is not ill-appearing, toxic-appearing or diaphoretic.  HENT:     Head: Normocephalic and atraumatic.  Eyes:     Conjunctiva/sclera: Conjunctivae normal.  Cardiovascular:     Rate and Rhythm: Normal rate and regular rhythm.     Heart sounds: No murmur heard. Pulmonary:     Effort: Pulmonary effort is normal. No respiratory distress.     Breath sounds: Normal breath sounds.  Abdominal:     General: Abdomen is flat. Bowel sounds are normal. There is no distension.     Palpations: Abdomen is soft.     Tenderness: There is no abdominal tenderness. There is left CVA tenderness. There is no right CVA tenderness.  Genitourinary:  Comments: Deferred initially with no groin pain Musculoskeletal:        General: No swelling.     Cervical back: Neck supple.  Skin:    General: Skin is warm and dry.     Capillary Refill: Capillary refill takes less than 2 seconds.     Coloration: Skin is not pale.     Findings: No rash.  Neurological:     Mental Status: He is alert.  Psychiatric:        Mood and Affect: Mood normal.     ED Results / Procedures / Treatments   Labs (all labs ordered are listed, but only abnormal results are displayed) Labs  Reviewed  COMPREHENSIVE METABOLIC PANEL - Abnormal; Notable for the following components:      Result Value   Glucose, Bld 100 (*)    BUN 23 (*)    Creatinine, Ser 1.36 (*)    All other components within normal limits  CBC - Abnormal; Notable for the following components:   WBC 11.1 (*)    All other components within normal limits  URINALYSIS, ROUTINE W REFLEX MICROSCOPIC - Abnormal; Notable for the following components:   APPearance HAZY (*)    pH 8.5 (*)    All other components within normal limits  LIPASE, BLOOD    EKG None  Radiology CT Renal Stone Study  Result Date: 03/15/2023 CLINICAL DATA:  Abdominal/flank pain, stone suspected EXAM: CT ABDOMEN AND PELVIS WITHOUT CONTRAST TECHNIQUE: Multidetector CT imaging of the abdomen and pelvis was performed following the standard protocol without IV contrast. RADIATION DOSE REDUCTION: This exam was performed according to the departmental dose-optimization program which includes automated exposure control, adjustment of the mA and/or kV according to patient size and/or use of iterative reconstruction technique. COMPARISON:  06/19/2018 FINDINGS: Lower chest: No acute abnormality. Hepatobiliary: No focal liver abnormality is seen. Status post cholecystectomy. No biliary dilatation. Pancreas: Unremarkable Spleen: Unremarkable Adrenals/Urinary Tract: The adrenal glands are unremarkable. The kidneys are normal in size and position. There is mild left hydronephrosis and perinephric stranding secondary to an obstructing 1-2 mm calculus at the left ureterovesicular junction. No additional nephro or urolithiasis. No hydronephrosis on the right. The bladder is unremarkable. Stomach/Bowel: Stomach is within normal limits. Appendix appears normal. No evidence of bowel wall thickening, distention, or inflammatory changes. Vascular/Lymphatic: Aortic atherosclerosis. No enlarged abdominal or pelvic lymph nodes. Reproductive: Prostate is unremarkable. Other:  Interval umbilical hernia repair with mesh. No recurrent abdominal wall hernia. Musculoskeletal: No acute or significant osseous findings. IMPRESSION: 1. Obstructing 1-2 mm calculus at the left ureterovesicular junction resulting in mild left hydronephrosis and perinephric stranding. Aortic Atherosclerosis (ICD10-I70.0). Electronically Signed   By: Helyn Numbers M.D.   On: 03/15/2023 21:43    Procedures Procedures    Medications Ordered in ED Medications  magnesium sulfate IVPB 2 g 50 mL (2 g Intravenous New Bag/Given 03/15/23 2209)  morphine (PF) 4 MG/ML injection 4 mg (4 mg Intravenous Given 03/15/23 2100)  ondansetron (ZOFRAN) injection 4 mg (4 mg Intravenous Given 03/15/23 2100)  HYDROmorphone (DILAUDID) injection 1 mg (1 mg Intravenous Given 03/15/23 2147)  ketorolac (TORADOL) 15 MG/ML injection 15 mg (15 mg Intravenous Given 03/15/23 2207)    ED Course/ Medical Decision Making/ A&P                             Medical Decision Making Amount and/or Complexity of Data Reviewed Labs: ordered. Radiology: ordered.  Risk  Prescription drug management.    Jake Melendez is a 52 y.o. male with a past medical history significant for asthma, hyperlipidemia, previous umbilical hernia repair and previous cholecystectomy who presents with left flank/back/abdominal pain, nausea, and urgency today.  According to patient, today he started having some urinary urgency and felt like he had to urinate.  He denies any dysuria or hematuria and denies any trauma but reports this evening started having severe pain in his left side going to his left back and his left abdomen.  Denies pain in his scrotum or groin.  Denies any vomiting was had some nausea.  He reports no chest pain, shortness of breath or palpitations.  Denies any pain in extremities.  Denies trauma or rashes to suggest shingles.  Has never had this before.  No history of kidney stones to his knowledge.  He had normal bowel movement earlier  today with no bleeding.  No constipation or diarrhea reported.  On exam, lungs clear.  Chest nontender.  Abdomen is nontender but left flank and left CVA area are tender.  No rash to suggest shingles.  No midline back tenderness.  Good pulses in extremities.  Patient resting comfortably now that he has received some morphine and Zofran.  Given his description of symptoms and exam I am somewhat concerned about kidney stone.  Will get stone study and will get labs.  Will get urinalysis to look for infection given the urgency.  With his lack of abdominal tenderness have less suspicion for etiology such as diverticulitis bowel obstruction or other intra-abdominal pathology.  Will get the stone study and reassess.  If labs are reassuring and stone is seen, will consider Toradol and magnesium.  Anticipate reassessment after workup.  Patient's workup does show evidence of a small 1-2 mm kidney stone causing some hydronephrosis and stranding.  Urinalysis does not show nitrites or leukocytes, doubt infected stone.  Kidney function unchanged from prior and I suspect is slightly dehydrated.  Rest of labs reassuring.  He is feeling much better after medications.  Patient will have a p.o. challenge and make sure he is feeling better and then will discharge home for outpatient urology follow-up.  Will give prescription for pain medicine, nausea medicine, and Flomax in case he still needs to finish passing it at home.  Patient understand return precautions, dissipate discharge after reassessment and p.o. challenge.         Final Clinical Impression(s) / ED Diagnoses Final diagnoses:  Kidney stone on left side  Left flank pain    Clinical Impression: 1. Kidney stone on left side   2. Left flank pain     Disposition: Discharge  Condition: Good  I have discussed the results, Dx and Tx plan with the pt(& family if present). He/she/they expressed understanding and agree(s) with the plan. Discharge  instructions discussed at great length. Strict return precautions discussed and pt &/or family have verbalized understanding of the instructions. No further questions at time of discharge.    New Prescriptions   ONDANSETRON (ZOFRAN-ODT) 4 MG DISINTEGRATING TABLET    Take 1 tablet (4 mg total) by mouth every 8 (eight) hours as needed for nausea or vomiting.   OXYCODONE-ACETAMINOPHEN (PERCOCET/ROXICET) 5-325 MG TABLET    Take 1 tablet by mouth every 4 (four) hours as needed for severe pain.   TAMSULOSIN (FLOMAX) 0.4 MG CAPS CAPSULE    Take 1 capsule (0.4 mg total) by mouth daily as needed (flank pain).    Follow Up: No  follow-up provider specified.     Isola Mehlman, Canary Brim, MD 03/15/23 2259

## 2023-03-15 NOTE — ED Triage Notes (Signed)
Left lower abdominal pain with pain radiating to left flank since today and has been getting severe since the last 30 minutes, around 5pm pt felt like he might be getting a UTI (frequency and urgency).

## 2023-03-15 NOTE — ED Notes (Signed)
Patient to CT via wheelchair.
